# Patient Record
Sex: Male | Born: 2000 | Race: White | Hispanic: Yes | Marital: Single | Smoking: Never smoker
Health system: Southern US, Community
[De-identification: ages and names within clinical notes are randomized; demographics above are authoritative.]

## PROBLEM LIST (undated history)

## (undated) DIAGNOSIS — E663 Overweight: Secondary | ICD-10-CM

## (undated) DIAGNOSIS — J45909 Unspecified asthma, uncomplicated: Secondary | ICD-10-CM

## (undated) DIAGNOSIS — T781XXA Other adverse food reactions, not elsewhere classified, initial encounter: Secondary | ICD-10-CM

## (undated) DIAGNOSIS — J309 Allergic rhinitis, unspecified: Secondary | ICD-10-CM

## (undated) HISTORY — DX: Overweight: E66.3

## (undated) HISTORY — DX: Allergic rhinitis, unspecified: J30.9

## (undated) HISTORY — DX: Unspecified asthma, uncomplicated: J45.909

## (undated) HISTORY — DX: Other adverse food reactions, not elsewhere classified, initial encounter: T78.1XXA

---
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
## 2020-01-11 ENCOUNTER — Ambulatory Visit: Payer: Medicaid Other | Admitting: Adult Health Nurse Practitioner

## 2021-01-29 NOTE — Patient Instructions (Incomplete)
Allergic rhinitis (grass, tree, mold, dust mite) Continue Zyrtec (cetirizine) 10 mg once a day as needed for runny nose or itching plan continue fluticasone nasal spray using 2 sprays each nostril once a day as needed for stuffiness Continue avoidance measures directed towards grass, tree, mold, and dust mite  Allergic conjunctivitis Continue Pataday 1 drop each eye once a day as needed for itchy watery eyes May use Natural Tears, and eye lubricant as needed  Asthma Continue albuterol 2 puffs every 4-6 hours as needed for cough, wheeze, tightness in chest, or shortness of breath.  Oral allergy syndrome Continue to avoid apples and other foods that cause itching - The oral allergy syndrome (OAS) or pollen-food allergy syndrome (PFAS) is a relatively common form of food allergy, particularly in adults. It typically occurs in people who have pollen allergies when the immune system "sees" proteins on the food that look like proteins on the pollen. This results in the allergy antibody (IgE) binding to the food instead of the pollen. Patients typically report itching and/or mild swelling of the mouth and throat immediately following ingestion of certain uncooked fruits (including nuts) or raw vegetables. Only a very small number of affected individuals experience systemic allergic reactions, such as anaphylaxis which occurs with true food allergies.   Please let us know if this treatment plan is not working well for you. Schedule a follow-up appointment in

## 2021-02-01 ENCOUNTER — Ambulatory Visit: Payer: Self-pay | Admitting: Family

## 2021-02-14 ENCOUNTER — Ambulatory Visit: Payer: Medicaid Other | Admitting: Allergy

## 2021-02-19 ENCOUNTER — Encounter: Payer: Self-pay | Admitting: Allergy & Immunology

## 2021-02-19 ENCOUNTER — Other Ambulatory Visit: Payer: Self-pay

## 2021-02-19 ENCOUNTER — Ambulatory Visit (INDEPENDENT_AMBULATORY_CARE_PROVIDER_SITE_OTHER): Payer: Medicaid Other | Admitting: Allergy & Immunology

## 2021-02-19 VITALS — BP 116/72 | HR 76 | Temp 98.7°F | Resp 17 | Ht 68.75 in | Wt 147.4 lb

## 2021-02-19 DIAGNOSIS — J301 Allergic rhinitis due to pollen: Secondary | ICD-10-CM | POA: Diagnosis not present

## 2021-02-19 DIAGNOSIS — J4520 Mild intermittent asthma, uncomplicated: Secondary | ICD-10-CM

## 2021-02-19 DIAGNOSIS — T781XXD Other adverse food reactions, not elsewhere classified, subsequent encounter: Secondary | ICD-10-CM

## 2021-02-19 MED ORDER — AZELASTINE HCL 0.05 % OP SOLN: OPHTHALMIC | 5 refills | Status: AC

## 2021-02-19 MED ORDER — ALBUTEROL SULFATE HFA 108 (90 BASE) MCG/ACT IN AERS: 2.0000 | INHALATION_SPRAY | RESPIRATORY_TRACT | 5 refills | Status: AC | PRN

## 2021-02-19 MED ORDER — EPINEPHRINE 0.3 MG/0.3ML IJ SOAJ: 0.3000 mg | Freq: Once | INTRAMUSCULAR | 1 refills | Status: AC

## 2021-02-19 MED ORDER — FLUTICASONE PROPIONATE 50 MCG/ACT NA SUSP: NASAL | 5 refills | Status: AC

## 2021-02-19 MED ORDER — CETIRIZINE HCL 10 MG PO TABS: ORAL_TABLET | ORAL | 11 refills | Status: AC

## 2021-02-19 MED ORDER — PAZEO 0.7 % OP SOLN: 1.0000 [drp] | Freq: Every day | OPHTHALMIC | 50 refills | Status: AC | PRN

## 2021-02-19 NOTE — Progress Notes (Signed)
FOLLOW UP  Date of Service/Encounter:  02/19/21   Assessment:   Perennial and seasonal allergic rhinitis  Intermittent asthma, uncomplicated  Oral allergy syndrome  Plan/Recommendations:   1. Mild intermittent asthma, uncomplicated - Lung testing looks good. - We are not going to make any changes at this time. - Continue with albuterol as needed.   2. Seasonal allergic rhinitis due to pollen - Restart cetirizine 10mg  1-2 tablets daily as needed. - Restart fluticasone one spray per nostril daily as needed.   3. Pollen-food allergy syndrome - EpiPen refilled today. - Continue to avoid all of your triggering foods.  4. Return in about 1 year (around 02/19/2022).    Subjective:   Wayne Nunez is a 20 y.o. male presenting today for follow up of  Chief Complaint  Patient presents with  . Medication Refill    Itchy red eyes.  . Cough    Dry cough    Wayne Nunez has a history of the following: Patient Active Problem List   Diagnosis Date Noted  . Allergic conjunctivitis of both eyes 01/05/2019  . Comedonal acne 09/14/2018  . Seasonal allergic rhinitis due to pollen 06/10/2017  . Mild intermittent asthma without complication 06/21/2016  . Chronic allergic rhinitis 05/26/2013  . Pollen-food allergy 05/26/2013    History obtained from: chart review and patient.  Wayne Nunez is a 20 y.o. male presenting for a follow up visit.  He was last seen in March 2020.  At that time, he was doing well on Zyrtec as well as Flonase.  For his allergic rhinoconjunctivitis, he was placed on Pazeo 1 drop per eye daily as needed.  His asthma was controlled with albuterol as needed.  He was encouraged to continue to avoid apples and other foods that were triggers of his oral allergy syndrome.  Since last visit, he has mostly done well. The last time that he had his medicine as months ago.   Asthma/Respiratory Symptom History: He remains on the albuterol as needed. He has not needed  systemic steroids in several months. Overall symptoms are well controlled. He has not had any nighttime symptoms and has not been to the ED for his breathing at all.   Allergic Rhinitis Symptom History: He was using some OTC eye drops but these are not working. He was on cetirizine and fluticasone. He has not needed any antibiotics at all for his symptoms. He is doing well overall.   Food Allergy Symptom History: He continues to avoid apples and pears.    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 He seems to enjoy his work there. He is thinking about going to [place] for a trip. His girlfriend is becoming a CNA for a nursing home.  Otherwise, there have been no changes to his past medical history, surgical history, family history, or social history.    Review of Systems  Constitutional: Negative.  Negative for chills, fever, malaise/fatigue and weight loss.  HENT: Positive for congestion. Negative for ear discharge, ear pain and sinus pain.   Eyes: Negative for pain, discharge and redness.  Respiratory: Positive for cough. Negative for sputum production, shortness of breath and wheezing.   Cardiovascular: Negative.  Negative for chest pain and palpitations.  Gastrointestinal: Negative for abdominal pain, constipation, diarrhea, heartburn, nausea and vomiting.  Skin: Negative.  Negative for itching and rash.  Neurological: Negative for dizziness and headaches.  Endo/Heme/Allergies: Positive for environmental allergies. Does not bruise/bleed easily.       Objective:  Blood pressure 116/72, pulse 76, temperature 98.7 F (37.1 C), temperature source Temporal, resp. rate 17, height 5' 8.75" (1.746 m), weight 147 lb 6.4 oz (66.9 kg), SpO2 98 %. Body mass index is 21.93 kg/m.   Physical Exam:  Physical Exam Constitutional:      Appearance: He is well-developed.     Comments: Pleasant male.  Cooperative with the exam.  HENT:     Head: Normocephalic and atraumatic.      Right Ear: Tympanic membrane, ear canal and external ear normal.     Left Ear: Tympanic membrane, ear canal and external ear normal.     Nose: No nasal deformity, septal deviation, mucosal edema or rhinorrhea.     Right Turbinates: Enlarged and swollen.     Left Turbinates: Enlarged and swollen.     Right Sinus: No maxillary sinus tenderness or frontal sinus tenderness.     Left Sinus: No maxillary sinus tenderness or frontal sinus tenderness.     Mouth/Throat:     Mouth: Mucous membranes are not pale and not dry.     Pharynx: Uvula midline.  Eyes:     General:        Right eye: No discharge.        Left eye: No discharge.     Conjunctiva/sclera: Conjunctivae normal.     Right eye: Right conjunctiva is not injected. No chemosis.    Left eye: Left conjunctiva is not injected. No chemosis.    Pupils: Pupils are equal, round, and reactive to light.  Cardiovascular:     Rate and Rhythm: Normal rate and regular rhythm.     Heart sounds: Normal heart sounds.  Pulmonary:     Effort: Pulmonary effort is normal. No tachypnea, accessory muscle usage or respiratory distress.     Breath sounds: Normal breath sounds. No wheezing, rhonchi or rales.     Comments: Moving air well in all lung fields.  No increased work of breathing. Chest:     Chest wall: No tenderness.  Lymphadenopathy:     Cervical: No cervical adenopathy.  Skin:    Coloration: Skin is not pale.     Findings: No abrasion, erythema, petechiae or rash. Rash is not papular, urticarial or vesicular.  Neurological:     Mental Status: He is alert.      Diagnostic studies:    Spirometry: results abnormal (FEV1: 3.65/87%, FVC: 3.77/77%, FEV1/FVC: 94%).    Spirometry consistent with possible restrictive disease.   Allergy Studies: none        Malachi Bonds, MD  [facility]

## 2021-02-19 NOTE — Patient Instructions (Addendum)
1. Mild intermittent asthma, uncomplicated - Lung testing looks good. - We are not going to make any changes at this time. - Continue with albuterol as needed.   2. Seasonal allergic rhinitis due to pollen - Restart cetirizine 10mg  1-2 tablets daily as needed. - Restart fluticasone one spray per nostril daily as needed.   3. Pollen-food allergy syndrome - EpiPen refilled today. - Continue to avoid all of your triggering foods.  4. Return in about 1 year (around 02/19/2022).    Please inform 02/21/2022 of any Emergency Department visits, hospitalizations, or changes in symptoms. Call us before going to the ED for breathing or allergy symptoms since we might be able to fit you in for a sick visit. Feel free to contact us anytime with any questions, problems, or concerns.  It was a pleasure to meet you today!  Websites that have reliable patient information: 1. American Academy of Asthma, Allergy, and Immunology: www.aaaai.org 2. Food Allergy Research and Education (FARE): foodallergy.org 3. Mothers of Asthmatics: http://www.asthmacommunitynetwork.org 4. American College of Allergy, Asthma, and Immunology: www.acaai.org   COVID-19 Vaccine Information can be found at: [place] For questions related to vaccine distribution or appointments, please email [email or web address] or call [phone].   We realize that you might be concerned about having an allergic reaction to the COVID19 vaccines. To help with that concern, WE ARE OFFERING THE COVID19 VACCINES IN OUR OFFICE! Ask the front desk for dates!     "Like" [phone] on Facebook and Instagram for our latest updates!      A healthy democracy works best when [place] participate! Make sure you are registered to vote! If you have moved or changed any of your contact information, you will need to get this updated before voting!  In some cases, you MAY be able to register to  vote online: [employer]
